# Patient Record
Sex: Female | Born: 1990 | Race: Black or African American | Hispanic: No | Marital: Single | Smoking: Never smoker
Health system: Southern US, Community
[De-identification: ages and names within clinical notes are randomized; demographics above are authoritative.]

---
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
## 2008-10-27 ENCOUNTER — Emergency Department (HOSPITAL_COMMUNITY): Admission: EM | Admit: 2008-10-27 | Discharge: 2008-10-27 | Payer: Self-pay | Admitting: Family Medicine

## 2010-07-07 ENCOUNTER — Emergency Department (HOSPITAL_COMMUNITY)
Admission: EM | Admit: 2010-07-07 | Discharge: 2010-07-07 | Payer: Self-pay | Source: Home / Self Care | Admitting: Family Medicine

## 2010-09-20 LAB — URINE CULTURE
Colony Count: >=100000
Culture  Setup Time: 201112282138

## 2010-09-20 LAB — POCT URINALYSIS DIPSTICK
Bilirubin Urine: NEGATIVE
Glucose, UA: NEGATIVE mg/dL
Ketones, ur: NEGATIVE mg/dL
Nitrite: NEGATIVE
Protein, ur: NEGATIVE mg/dL
Specific Gravity, Urine: 1.015 (ref 1.005–1.030)
Urobilinogen, UA: 0.2 mg/dL (ref 0.0–1.0)
pH: 7 (ref 5.0–8.0)

## 2010-09-20 LAB — POCT PREGNANCY, URINE: Preg Test, Ur: NEGATIVE

## 2010-10-20 LAB — POCT PREGNANCY, URINE: Preg Test, Ur: NEGATIVE

## 2010-10-20 LAB — POCT URINALYSIS DIP (DEVICE)
Bilirubin Urine: NEGATIVE
Glucose, UA: NEGATIVE mg/dL
Hgb urine dipstick: NEGATIVE
Ketones, ur: NEGATIVE mg/dL
Nitrite: NEGATIVE
Protein, ur: NEGATIVE mg/dL
Specific Gravity, Urine: 1.02 (ref 1.005–1.030)
Urobilinogen, UA: 1 mg/dL (ref 0.0–1.0)
pH: 7 (ref 5.0–8.0)

## 2010-10-20 LAB — RPR: RPR Ser Ql: NONREACTIVE

## 2010-10-20 LAB — HIV ANTIBODY (ROUTINE TESTING W REFLEX): HIV: NONREACTIVE

## 2010-10-20 LAB — GC/CHLAMYDIA PROBE AMP, URINE
Chlamydia, Swab/Urine, PCR: NEGATIVE
GC Probe Amp, Urine: POSITIVE — AB
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
## 2011-04-21 LAB — RPR: RPR Ser Ql: NONREACTIVE

## 2011-04-21 LAB — CBC
HCT: 27.2 — ABNORMAL LOW
HCT: 32.2 — ABNORMAL LOW
Hemoglobin: 10.8 — ABNORMAL LOW
Hemoglobin: 9.2 — ABNORMAL LOW
MCHC: 33.5
MCHC: 33.8
MCV: 83.7
MCV: 83.9
Platelets: 301
Platelets: 382 — ABNORMAL HIGH
RBC: 3.24 — ABNORMAL LOW
RBC: 3.85
RDW: 14.1 — ABNORMAL HIGH
RDW: 14.3 — ABNORMAL HIGH
WBC: 13.9 — ABNORMAL HIGH
WBC: 17.6 — ABNORMAL HIGH

## 2011-04-21 LAB — HEPATITIS B SURFACE ANTIGEN: Hepatitis B Surface Ag: NEGATIVE

## 2011-07-03 ENCOUNTER — Encounter: Payer: Self-pay | Admitting: *Deleted

## 2011-07-03 ENCOUNTER — Emergency Department (HOSPITAL_COMMUNITY): Payer: Self-pay

## 2011-07-03 ENCOUNTER — Emergency Department (HOSPITAL_COMMUNITY)
Admission: EM | Admit: 2011-07-03 | Discharge: 2011-07-03 | Disposition: A | Discharge status: Home / Self Care | Payer: Self-pay | Attending: Emergency Medicine | Admitting: Emergency Medicine

## 2011-07-03 DIAGNOSIS — R220 Localized swelling, mass and lump, head: Secondary | ICD-10-CM | POA: Insufficient documentation

## 2011-07-03 DIAGNOSIS — Y9229 Other specified public building as the place of occurrence of the external cause: Secondary | ICD-10-CM | POA: Insufficient documentation

## 2011-07-03 DIAGNOSIS — R51 Headache: Secondary | ICD-10-CM | POA: Insufficient documentation

## 2011-07-03 DIAGNOSIS — S0180XA Unspecified open wound of other part of head, initial encounter: Secondary | ICD-10-CM | POA: Insufficient documentation

## 2011-07-03 DIAGNOSIS — S51809A Unspecified open wound of unspecified forearm, initial encounter: Secondary | ICD-10-CM | POA: Insufficient documentation

## 2011-07-03 DIAGNOSIS — S1093XA Contusion of unspecified part of neck, initial encounter: Secondary | ICD-10-CM | POA: Insufficient documentation

## 2011-07-03 DIAGNOSIS — S0003XA Contusion of scalp, initial encounter: Secondary | ICD-10-CM | POA: Insufficient documentation

## 2011-07-03 MED ORDER — OXYCODONE-ACETAMINOPHEN 5-325 MG PO TABS: 2.0000 | ORAL_TABLET | ORAL | Status: AC | PRN

## 2011-07-03 MED ORDER — OXYCODONE-ACETAMINOPHEN 5-325 MG PO TABS
2.0000 | ORAL_TABLET | Freq: Once | ORAL | Status: AC
  Administered 2011-07-03: 2 via ORAL
  Filled 2011-07-03: qty 2

## 2011-07-03 MED ORDER — TETANUS-DIPHTH-ACELL PERTUSSIS 5-2.5-18.5 LF-MCG/0.5 IM SUSP
0.5000 mL | Freq: Once | INTRAMUSCULAR | Status: AC
  Administered 2011-07-03: 0.5 mL via INTRAMUSCULAR
  Filled 2011-07-03: qty 0.5
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
## 2011-07-03 NOTE — ED Provider Notes (Signed)
Medical screening examination/treatment/procedure(s) were performed by non-physician practitioner and as supervising physician I was immediately available for consultation/collaboration.  Ethelda Chick, MD 07/03/11 [phone]
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
## 2011-07-03 NOTE — ED Provider Notes (Signed)
Medical screening examination/treatment/procedure(s) were performed by non-physician practitioner and as supervising physician I was immediately available for consultation/collaboration.  Martha K Linker, MD 07/03/11 0921 

## 2011-07-03 NOTE — ED Notes (Signed)
Pt given discharge instructions, stated understanding, amb indep
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
## 2013-11-19 ENCOUNTER — Encounter (HOSPITAL_COMMUNITY): Payer: Self-pay | Admitting: Emergency Medicine

## 2013-11-19 ENCOUNTER — Emergency Department (INDEPENDENT_AMBULATORY_CARE_PROVIDER_SITE_OTHER)
Admission: EM | Admit: 2013-11-19 | Discharge: 2013-11-19 | Disposition: A | Discharge status: Home / Self Care | Payer: Self-pay | Source: Home / Self Care

## 2013-11-19 ENCOUNTER — Other Ambulatory Visit (HOSPITAL_COMMUNITY)
Admission: RE | Admit: 2013-11-19 | Discharge: 2013-11-19 | Disposition: A | Discharge status: Home / Self Care | Payer: Self-pay | Source: Ambulatory Visit | Attending: Family Medicine | Admitting: Family Medicine

## 2013-11-19 DIAGNOSIS — Z113 Encounter for screening for infections with a predominantly sexual mode of transmission: Secondary | ICD-10-CM | POA: Insufficient documentation

## 2013-11-19 DIAGNOSIS — N898 Other specified noninflammatory disorders of vagina: Secondary | ICD-10-CM

## 2013-11-19 DIAGNOSIS — N760 Acute vaginitis: Secondary | ICD-10-CM | POA: Insufficient documentation

## 2013-11-19 DIAGNOSIS — Z202 Contact with and (suspected) exposure to infections with a predominantly sexual mode of transmission: Secondary | ICD-10-CM

## 2013-11-19 LAB — POCT URINALYSIS DIP (DEVICE)
Bilirubin Urine: NEGATIVE
Glucose, UA: NEGATIVE mg/dL
Hgb urine dipstick: NEGATIVE
Ketones, ur: NEGATIVE mg/dL
Leukocytes, UA: NEGATIVE
Nitrite: NEGATIVE
Protein, ur: NEGATIVE mg/dL
Specific Gravity, Urine: 1.025 (ref 1.005–1.030)
Urobilinogen, UA: 0.2 mg/dL (ref 0.0–1.0)
pH: 6 (ref 5.0–8.0)

## 2013-11-19 LAB — POCT PREGNANCY, URINE: Preg Test, Ur: NEGATIVE

## 2013-11-19 MED ORDER — METRONIDAZOLE 500 MG PO TABS: 500.0000 mg | ORAL_TABLET | Freq: Two times a day (BID) | ORAL | Status: DC
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
## 2013-11-19 NOTE — ED Provider Notes (Signed)
Medical screening examination/treatment/procedure(s) were performed by resident physician or non-physician practitioner and as supervising physician I was immediately available for consultation/collaboration.   KINDL,JAMES DOUGLAS MD.   James D Kindl, MD 11/19/13 1719 

## 2013-11-19 NOTE — ED Notes (Signed)
Please call patient at [phone] for any lab issues

## 2013-11-19 NOTE — Discharge Instructions (Signed)
Vaginitis Vaginitis is an inflammation of the vagina. It is most often caused by a change in the normal balance of the bacteria and yeast that live in the vagina. This change in balance causes an overgrowth of certain bacteria or yeast, which causes the inflammation. There are different types of vaginitis, but the most common types are:  Bacterial vaginosis.  Yeast infection (candidiasis).  Trichomoniasis vaginitis. This is a sexually transmitted infection (STI).  Viral vaginitis.  Atropic vaginitis.  Allergic vaginitis. CAUSES  The cause depends on the type of vaginitis. Vaginitis can be caused by:  Bacteria (bacterial vaginosis).  Yeast (yeast infection).  A parasite (trichomoniasis vaginitis)  A virus (viral vaginitis).  Low hormone levels (atrophic vaginitis). Low hormone levels can occur during pregnancy, breastfeeding, or after menopause.  Irritants, such as bubble baths, scented tampons, and feminine sprays (allergic vaginitis). Other factors can change the normal balance of the yeast and bacteria that live in the vagina. These include:  Antibiotic medicines.  Poor hygiene.  Diaphragms, vaginal sponges, spermicides, birth control pills, and intrauterine devices (IUD).  Sexual intercourse.  Infection.  Uncontrolled diabetes.  A weakened immune system. SYMPTOMS  Symptoms can vary depending on the cause of the vaginitis. Common symptoms include:  Abnormal vaginal discharge.  The discharge is white, gray, or yellow with bacterial vaginosis.  The discharge is thick, white, and cheesy with a yeast infection.  The discharge is frothy and yellow or greenish with trichomoniasis.  A bad vaginal odor.  The odor is fishy with bacterial vaginosis.  Vaginal itching, pain, or swelling.  Painful intercourse.  Pain or burning when urinating. Sometimes, there are no symptoms. TREATMENT  Treatment will vary depending on the type of infection.   Bacterial  vaginosis and trichomoniasis are often treated with antibiotic creams or pills.  Yeast infections are often treated with antifungal medicines, such as vaginal creams or suppositories.  Viral vaginitis has no cure, but symptoms can be treated with medicines that relieve discomfort. Your sexual partner should be treated as well.  Atrophic vaginitis may be treated with an estrogen cream, pill, suppository, or vaginal ring. If vaginal dryness occurs, lubricants and moisturizing creams may help. You may be told to avoid scented soaps, sprays, or douches.  Allergic vaginitis treatment involves quitting the use of the product that is causing the problem. Vaginal creams can be used to treat the symptoms. HOME CARE INSTRUCTIONS   Take all medicines as directed by your caregiver.  Keep your genital area clean and dry. Avoid soap and only rinse the area with water.  Avoid douching. It can remove the healthy bacteria in the vagina.  Do not use tampons or have sexual intercourse until your vaginitis has been treated. Use sanitary pads while you have vaginitis.  Wipe from front to back. This avoids the spread of bacteria from the rectum to the vagina.  Let air reach your genital area.  Wear cotton underwear to decrease moisture buildup.  Avoid wearing underwear while you sleep until your vaginitis is gone.  Avoid tight pants and underwear or nylons without a cotton panel.  Take off wet clothing (especially bathing suits) as soon as possible.  Use mild, non-scented products. Avoid using irritants, such as:  Scented feminine sprays.  Fabric softeners.  Scented detergents.  Scented tampons.  Scented soaps or bubble baths.  Practice safe sex and use condoms. Condoms may prevent the spread of trichomoniasis and viral vaginitis. SEEK MEDICAL CARE IF:   You have abdominal pain.  You   have a fever or persistent symptoms for more than 2 3 days.  You have a fever and your symptoms suddenly  get worse. Document Released: 04/24/2007 Document Revised: 03/21/2012 Document Reviewed: 12/08/2011 [employer] Patient Information 2014 [employer].  

## 2013-11-20 LAB — CERVICOVAGINAL ANCILLARY ONLY
Chlamydia: NEGATIVE
Neisseria Gonorrhea: NEGATIVE
WET PREP (BD AFFIRM): NEGATIVE
WET PREP (BD AFFIRM): POSITIVE — AB
Wet Prep (BD Affirm): NEGATIVE

## 2013-11-20 NOTE — ED Notes (Signed)
GC/Chlamydia neg., Affirm: Candida and Trich neg., Gardnerella pos.  Pt. adequately treated with Flagyl. Desiree LucySuzanne M [facility] ClinicYork 11/20/2013

## 2014-05-12 ENCOUNTER — Encounter (HOSPITAL_COMMUNITY): Payer: Self-pay | Admitting: Emergency Medicine

## 2016-03-26 ENCOUNTER — Encounter (HOSPITAL_COMMUNITY): Payer: Self-pay | Admitting: Emergency Medicine

## 2016-03-26 ENCOUNTER — Emergency Department (HOSPITAL_COMMUNITY)
Admission: EM | Admit: 2016-03-26 | Discharge: 2016-03-26 | Disposition: A | Discharge status: Home / Self Care | Payer: Self-pay | Attending: Emergency Medicine | Admitting: Emergency Medicine

## 2016-03-26 DIAGNOSIS — Z792 Long term (current) use of antibiotics: Secondary | ICD-10-CM | POA: Insufficient documentation

## 2016-03-26 DIAGNOSIS — Z7252 High risk homosexual behavior: Secondary | ICD-10-CM | POA: Insufficient documentation

## 2016-03-26 DIAGNOSIS — Z7251 High risk heterosexual behavior: Secondary | ICD-10-CM

## 2016-03-26 LAB — WET PREP, GENITAL
SPERM: NONE SEEN
Trich, Wet Prep: NONE SEEN
YEAST WET PREP: NONE SEEN

## 2016-03-26 MED ORDER — AZITHROMYCIN 250 MG PO TABS
1000.0000 mg | ORAL_TABLET | Freq: Once | ORAL | Status: AC
  Administered 2016-03-26: 1000 mg via ORAL
  Filled 2016-03-26: qty 4

## 2016-03-26 MED ORDER — CEFTRIAXONE SODIUM 250 MG IJ SOLR
250.0000 mg | Freq: Once | INTRAMUSCULAR | Status: AC
  Administered 2016-03-26: 250 mg via INTRAMUSCULAR
  Filled 2016-03-26: qty 250

## 2016-03-26 MED ORDER — LIDOCAINE HCL 1 % IJ SOLN
INTRAMUSCULAR | Status: AC
  Administered 2016-03-26: 0.9 mL
  Filled 2016-03-26: qty 20

## 2016-03-26 NOTE — ED Provider Notes (Signed)
[facility]-EMERGENCY DEPT Provider Note   CSN: [id number] Arrival date & time: 03/26/16  0314     History   Chief Complaint Chief Complaint  Patient presents with  . Exposure to STD    HPI Audrey Joseph is a 25 y.o. female.  [age] year old female with no significant past medical history presents to the emergency department for STD evaluation. Patient states that she was notified that her female sexual partner was experiencing dysuria and penile discharge. She denies any symptoms, herself. She specifically denies fevers, vomiting, abdominal pain, vaginal bleeding, or vaginal discharge. She reports unprotected sexual intercourse with one female partner in the past 6 months.   The history is provided by the patient. No language interpreter was used.  Exposure to STD     History reviewed. No pertinent past medical history.  There are no active problems to display for this patient.   History reviewed. No pertinent surgical history.  OB History    Gravida Para Term Preterm AB Living   2 1 1  0 1 1   SAB TAB Ectopic Multiple Live Births   0 1 0 0         Home Medications    Prior to Admission medications   Medication Sig Start Date End Date Taking? Authorizing Provider  metroNIDAZOLE (FLAGYL) 500 MG tablet Take 1 tablet (500 mg total) by mouth 2 (two) times daily. X 7 days 11/19/13   Hayden Rasmussenavid Mabe, NP    Family History No family history on file.  Social History Social History  Substance Use Topics  . Smoking status: Never Smoker  . Smokeless tobacco: Never Used  . Alcohol use Yes     Allergies   Review of patient's allergies indicates no known allergies.   Review of Systems Review of Systems Ten systems reviewed and are negative for acute change, except as noted in the HPI.    Physical Exam Updated Vital Signs BP 140/90 (BP Location: Right Arm)   Pulse 68   Temp 98 F (36.7 C) (Oral)   LMP 03/05/2016 (Approximate)   SpO2 100%   Physical Exam  Constitutional:  She is oriented to person, place, and time. She appears well-developed and well-nourished. No distress.  Nontoxic and in no distress  HENT:  Head: Normocephalic and atraumatic.  Eyes: Conjunctivae and EOM are normal. No scleral icterus.  Neck: Normal range of motion.  Pulmonary/Chest: Effort normal. No respiratory distress.  Abdominal: Soft. She exhibits no distension. There is no tenderness. There is no guarding.  Soft, obese, nontender abdomen  Genitourinary: There is no rash, tenderness or lesion on the right labia. There is no rash, tenderness or lesion on the left labia. Uterus is not tender. Cervix exhibits no motion tenderness and no friability. Right adnexum displays no mass and no tenderness. Left adnexum displays no mass and no tenderness. No foreign body in the vagina.  Genitourinary Comments: Scant blood in vaginal vault. No cervical friability, cervical tenderness, or adnexal tenderness.  Musculoskeletal: Normal range of motion.  Neurological: She is alert and oriented to person, place, and time.  Skin: Skin is warm and dry. No rash noted. She is not diaphoretic. No erythema. No pallor.  Psychiatric: She has a normal mood and affect. Her behavior is normal.  Nursing note and vitals reviewed.    ED Treatments / Results  Labs (all labs ordered are listed, but only abnormal results are displayed) Labs Reviewed  WET PREP, GENITAL - Abnormal; Notable for the following:  Result Value   Clue Cells Wet Prep HPF POC PRESENT (*)    WBC, Wet Prep HPF POC MANY (*)    All other components within normal limits  GC/CHLAMYDIA PROBE AMP ([place]) NOT AT [facility]    EKG  EKG Interpretation None       Radiology No results found.  Procedures Procedures (including critical care time)  Medications Ordered in ED Medications  cefTRIAXone (ROCEPHIN) injection 250 mg (250 mg Intramuscular Given 03/26/16 0604)  azithromycin (ZITHROMAX) tablet 1,000 mg (1,000 mg Oral Given 03/26/16  0604)  lidocaine (XYLOCAINE) 1 % (with pres) injection (0.9 mLs  Given 03/26/16 0604)     Initial Impression / Assessment and Plan / ED Course  I have reviewed the triage vital signs and the nursing notes.  Pertinent labs & imaging results that were available during my care of the patient were reviewed by me and considered in my medical decision making (see chart for details).  Clinical Course    Patient to be discharged with instructions to follow up with the health department. Discussed importance of using protection when sexually active. Patient understands that they have GC/Chlamydia cultures pending and that they will need to inform all sexual partners if results return positive. Pt has been treated prophylacticly with azithromycin and rocephin due to pts history and wet prep with increased WBCs. Patient's symptoms not concerning for PID; she is hemodynamically stable and without cervical motion tenderness on pelvic exam. Return precautions discussed and provided. Patient discharged in satisfactory condition with no unaddressed concerns.   Final Clinical Impressions(s) / ED Diagnoses   Final diagnoses:  History of unprotected sex    New Prescriptions New Prescriptions   No medications on file     Antony Madura, PA-C 03/26/16 1610    Dione Booze, MD 03/26/16 [phone]

## 2016-03-26 NOTE — ED Triage Notes (Signed)
Pt here requesting STD check.  States she has had no symptoms at all but her partner has had discharge that started yesterday and she would like to be checked.

## 2016-03-26 NOTE — Discharge Instructions (Signed)
Do not engage in sexual intercourse for one week. Call to follow up on the results of your STD tests. Notify all sexual partners of their need to be tested and treated for STDs if your results are positive. Return for any new or concerning symptoms.

## 2016-03-28 LAB — GC/CHLAMYDIA PROBE AMP (~~LOC~~) NOT AT ARMC
CHLAMYDIA, DNA PROBE: NEGATIVE
NEISSERIA GONORRHEA: POSITIVE — AB

## 2016-03-29 ENCOUNTER — Telehealth (HOSPITAL_BASED_OUTPATIENT_CLINIC_OR_DEPARTMENT_OTHER): Payer: Self-pay | Admitting: Emergency Medicine

## 2016-05-03 ENCOUNTER — Telehealth (HOSPITAL_BASED_OUTPATIENT_CLINIC_OR_DEPARTMENT_OTHER): Payer: Self-pay | Admitting: Emergency Medicine

## 2016-05-03 NOTE — Telephone Encounter (Signed)
Lost to followup 

## 2020-07-11 NOTE — L&D Delivery Note (Addendum)
Operative Delivery Note  Patient pushed for less than 30 minutes after she was noted to be C/C/+1.  There was fetal bradycardia nadir 60's. Discussed with patient operative delivery with a vacuum. Verbal consent: obtained from patient. Risks and benefits discussed in detail.  Risks include, but are not limited to the risks of anesthesia, bleeding, infection, damage to maternal tissues, fetal cephalhematoma.  There is also the risk of inability to effect vaginal delivery of the head, or shoulder dystocia that cannot be resolved by established maneuvers, leading to the need for emergency cesarean section.  Head determined to be direct OP.  Bladder was just previously emptied by removal of indwelling foley catheter.  Epidural was found to be adequate.  Vertex was +3 station.   The Kiwi vacuum was placed.  With maternal effort and 3 pulls,  The baby head rotated and at 6:02 PM a viable and healthy female "Audrey Joseph" was delivered via Vaginal, Spontaneous.  Presentation: vertex; Position: Occiput,anterior; Station: +3. shoulders and body were easily delivered. Delayed cord clamping was done and cut by the father the infant handed to the waiting Pediatricians.  Infant was noted to be moving all four extremities and crying vigorously. Cord blood was obtained. The placenta delivered spontaneously intact 3 vessels noted.    IV pitocin and massage was done to alleviate any uterine atony.  Small right first degree vaginal laceration was repaired with 3-0 vicryl.  Patient tolerated delivery well, there were no complications.   APGAR: 8, 9; weight pending.    Anesthesia:  Epidural Instruments: Kiwi vacuum Episiotomy:  n/a Lacerations:  1st degree vaginal  Suture Repair: 3.0 vicryl Est. Blood Loss (mL):  200  Mom to postpartum.  Baby to Couplet care / Skin to Skin.  [name] 05/29/2021, 6:24 PM

## 2020-12-21 LAB — OB RESULTS CONSOLE RUBELLA ANTIBODY, IGM: Rubella: IMMUNE

## 2020-12-21 LAB — OB RESULTS CONSOLE HEPATITIS B SURFACE ANTIGEN: Hepatitis B Surface Ag: NEGATIVE

## 2021-02-08 HISTORY — PX: WISDOM TOOTH EXTRACTION: SHX21

## 2021-02-11 LAB — OB RESULTS CONSOLE GC/CHLAMYDIA: Gonorrhea: NEGATIVE

## 2021-03-22 LAB — OB RESULTS CONSOLE HIV ANTIBODY (ROUTINE TESTING): HIV: NONREACTIVE

## 2021-05-28 ENCOUNTER — Inpatient Hospital Stay (HOSPITAL_COMMUNITY)
Admission: AD | Admit: 2021-05-28 | Discharge: 2021-05-31 | DRG: 807 | Disposition: A | Discharge status: Home / Self Care | Payer: Medicaid Other | Attending: Obstetrics & Gynecology | Admitting: Obstetrics & Gynecology

## 2021-05-28 ENCOUNTER — Other Ambulatory Visit: Payer: Self-pay

## 2021-05-28 ENCOUNTER — Encounter (HOSPITAL_COMMUNITY): Payer: Self-pay | Admitting: Obstetrics & Gynecology

## 2021-05-28 DIAGNOSIS — O9902 Anemia complicating childbirth: Secondary | ICD-10-CM | POA: Diagnosis present

## 2021-05-28 DIAGNOSIS — R030 Elevated blood-pressure reading, without diagnosis of hypertension: Secondary | ICD-10-CM | POA: Diagnosis not present

## 2021-05-28 DIAGNOSIS — Z20822 Contact with and (suspected) exposure to covid-19: Secondary | ICD-10-CM | POA: Diagnosis present

## 2021-05-28 DIAGNOSIS — O99214 Obesity complicating childbirth: Secondary | ICD-10-CM | POA: Diagnosis present

## 2021-05-28 DIAGNOSIS — O4292 Full-term premature rupture of membranes, unspecified as to length of time between rupture and onset of labor: Principal | ICD-10-CM | POA: Diagnosis present

## 2021-05-28 DIAGNOSIS — O99893 Other specified diseases and conditions complicating puerperium: Secondary | ICD-10-CM | POA: Diagnosis not present

## 2021-05-28 DIAGNOSIS — O3663X0 Maternal care for excessive fetal growth, third trimester, not applicable or unspecified: Secondary | ICD-10-CM | POA: Diagnosis present

## 2021-05-28 DIAGNOSIS — D573 Sickle-cell trait: Secondary | ICD-10-CM

## 2021-05-28 DIAGNOSIS — Z3A37 37 weeks gestation of pregnancy: Secondary | ICD-10-CM

## 2021-05-28 DIAGNOSIS — Z23 Encounter for immunization: Secondary | ICD-10-CM

## 2021-05-28 NOTE — MAU Note (Signed)
..  Audrey Joseph is a 30 y.o. at [redacted]w[redacted]d here in MAU reporting: A gush of fluid around 11:15pm. Denies contractions. +FM.

## 2021-05-29 ENCOUNTER — Inpatient Hospital Stay (HOSPITAL_COMMUNITY): Payer: Medicaid Other | Admitting: Anesthesiology

## 2021-05-29 ENCOUNTER — Encounter (HOSPITAL_COMMUNITY): Payer: Self-pay | Admitting: Obstetrics & Gynecology

## 2021-05-29 DIAGNOSIS — Z3A37 37 weeks gestation of pregnancy: Secondary | ICD-10-CM

## 2021-05-29 DIAGNOSIS — Z23 Encounter for immunization: Secondary | ICD-10-CM | POA: Diagnosis not present

## 2021-05-29 DIAGNOSIS — R030 Elevated blood-pressure reading, without diagnosis of hypertension: Secondary | ICD-10-CM | POA: Diagnosis not present

## 2021-05-29 DIAGNOSIS — O9902 Anemia complicating childbirth: Secondary | ICD-10-CM | POA: Diagnosis present

## 2021-05-29 DIAGNOSIS — O99214 Obesity complicating childbirth: Secondary | ICD-10-CM | POA: Diagnosis present

## 2021-05-29 DIAGNOSIS — Z20822 Contact with and (suspected) exposure to covid-19: Secondary | ICD-10-CM | POA: Diagnosis present

## 2021-05-29 DIAGNOSIS — O4202 Full-term premature rupture of membranes, onset of labor within 24 hours of rupture: Secondary | ICD-10-CM | POA: Diagnosis not present

## 2021-05-29 DIAGNOSIS — O99893 Other specified diseases and conditions complicating puerperium: Secondary | ICD-10-CM | POA: Diagnosis not present

## 2021-05-29 DIAGNOSIS — O3663X0 Maternal care for excessive fetal growth, third trimester, not applicable or unspecified: Secondary | ICD-10-CM | POA: Diagnosis present

## 2021-05-29 DIAGNOSIS — D573 Sickle-cell trait: Secondary | ICD-10-CM | POA: Diagnosis present

## 2021-05-29 DIAGNOSIS — O4292 Full-term premature rupture of membranes, unspecified as to length of time between rupture and onset of labor: Secondary | ICD-10-CM | POA: Diagnosis present

## 2021-05-29 DIAGNOSIS — O26893 Other specified pregnancy related conditions, third trimester: Secondary | ICD-10-CM | POA: Diagnosis present

## 2021-05-29 LAB — CBC
HCT: 31.4 % — ABNORMAL LOW (ref 36.0–46.0)
HCT: 28.5 % — ABNORMAL LOW (ref 36.0–46.0)
Hemoglobin: 10.5 g/dL — ABNORMAL LOW (ref 12.0–15.0)
Hemoglobin: 9.7 g/dL — ABNORMAL LOW (ref 12.0–15.0)
MCH: 26.9 pg (ref 26.0–34.0)
MCH: 27.2 pg (ref 26.0–34.0)
MCHC: 33.4 g/dL (ref 30.0–36.0)
MCHC: 34 g/dL (ref 30.0–36.0)
MCV: 80.3 fL (ref 80.0–100.0)
MCV: 79.8 fL — ABNORMAL LOW (ref 80.0–100.0)
Platelets: 392 10*3/uL (ref 150–400)
Platelets: 356 10*3/uL (ref 150–400)
RBC: 3.91 MIL/uL (ref 3.87–5.11)
RBC: 3.57 MIL/uL — ABNORMAL LOW (ref 3.87–5.11)
RDW: 14.2 % (ref 11.5–15.5)
RDW: 14 % (ref 11.5–15.5)
WBC: 11.6 10*3/uL — ABNORMAL HIGH (ref 4.0–10.5)
WBC: 19.2 10*3/uL — ABNORMAL HIGH (ref 4.0–10.5)
nRBC: 0 % (ref 0.0–0.2)
nRBC: 0 % (ref 0.0–0.2)

## 2021-05-29 LAB — COMPREHENSIVE METABOLIC PANEL
ALT: 15 U/L (ref 0–44)
AST: 22 U/L (ref 15–41)
Albumin: 2.3 g/dL — ABNORMAL LOW (ref 3.5–5.0)
Alkaline Phosphatase: 99 U/L (ref 38–126)
Anion gap: 8 (ref 5–15)
BUN: 6 mg/dL (ref 6–20)
CO2: 19 mmol/L — ABNORMAL LOW (ref 22–32)
Calcium: 9 mg/dL (ref 8.9–10.3)
Chloride: 108 mmol/L (ref 98–111)
Creatinine, Ser: 0.71 mg/dL (ref 0.44–1.00)
GFR, Estimated: >60 mL/min (ref >60)
Glucose, Bld: 113 mg/dL — ABNORMAL HIGH (ref 70–99)
Potassium: 3.6 mmol/L (ref 3.5–5.1)
Sodium: 135 mmol/L (ref 135–145)
Total Bilirubin: 0.4 mg/dL (ref 0.3–1.2)
Total Protein: 5.9 g/dL — ABNORMAL LOW (ref 6.5–8.1)

## 2021-05-29 LAB — POCT FERN TEST: POCT Fern Test: POSITIVE

## 2021-05-29 LAB — TYPE AND SCREEN
ABO/RH(D): A POS
Antibody Screen: NEGATIVE

## 2021-05-29 LAB — RESP PANEL BY RT-PCR (FLU A&B, COVID) ARPGX2
Influenza A by PCR: NEGATIVE
Influenza B by PCR: NEGATIVE
SARS Coronavirus 2 by RT PCR: NEGATIVE

## 2021-05-29 LAB — HIV ANTIBODY (ROUTINE TESTING W REFLEX): HIV Screen 4th Generation wRfx: NONREACTIVE

## 2021-05-29 LAB — RPR: RPR Ser Ql: NONREACTIVE

## 2021-05-29 LAB — GROUP B STREP BY PCR: Group B strep by PCR: NEGATIVE

## 2021-05-29 MED ORDER — PRENATAL MULTIVITAMIN CH
1.0000 | ORAL_TABLET | Freq: Every day | ORAL | Status: DC
  Administered 2021-05-30 – 2021-05-31 (×2): 1 via ORAL
  Filled 2021-05-29 (×2): qty 1

## 2021-05-29 MED ORDER — BUPIVACAINE HCL (PF) 0.25 % IJ SOLN
INTRAMUSCULAR | Status: DC | PRN
  Administered 2021-05-29: 10 mL via EPIDURAL

## 2021-05-29 MED ORDER — OXYCODONE-ACETAMINOPHEN 5-325 MG PO TABS: 2.0000 | ORAL_TABLET | ORAL | Status: DC | PRN

## 2021-05-29 MED ORDER — ONDANSETRON HCL 4 MG/2ML IJ SOLN: 4.0000 mg | Freq: Four times a day (QID) | INTRAMUSCULAR | Status: DC | PRN

## 2021-05-29 MED ORDER — PHENYLEPHRINE 40 MCG/ML (10ML) SYRINGE FOR IV PUSH (FOR BLOOD PRESSURE SUPPORT): 80.0000 ug | PREFILLED_SYRINGE | INTRAVENOUS | Status: DC | PRN

## 2021-05-29 MED ORDER — SIMETHICONE 80 MG PO CHEW: 80.0000 mg | CHEWABLE_TABLET | ORAL | Status: DC | PRN

## 2021-05-29 MED ORDER — PENICILLIN G POT IN DEXTROSE 60000 UNIT/ML IV SOLN: 3.0000 10*6.[IU] | INTRAVENOUS | Status: DC

## 2021-05-29 MED ORDER — DIPHENHYDRAMINE HCL 25 MG PO CAPS: 25.0000 mg | ORAL_CAPSULE | Freq: Four times a day (QID) | ORAL | Status: DC | PRN

## 2021-05-29 MED ORDER — OXYTOCIN-SODIUM CHLORIDE 30-0.9 UT/500ML-% IV SOLN: 2.5000 [IU]/h | INTRAVENOUS | Status: DC | PRN

## 2021-05-29 MED ORDER — ACETAMINOPHEN 325 MG PO TABS
650.0000 mg | ORAL_TABLET | ORAL | Status: DC | PRN
  Administered 2021-05-29 – 2021-05-30 (×3): 650 mg via ORAL
  Filled 2021-05-29 (×3): qty 2

## 2021-05-29 MED ORDER — OXYTOCIN-SODIUM CHLORIDE 30-0.9 UT/500ML-% IV SOLN
1.0000 m[IU]/min | INTRAVENOUS | Status: DC
  Administered 2021-05-29: 2 m[IU]/min via INTRAVENOUS
  Filled 2021-05-29: qty 500

## 2021-05-29 MED ORDER — OXYCODONE-ACETAMINOPHEN 5-325 MG PO TABS: 1.0000 | ORAL_TABLET | ORAL | Status: DC | PRN

## 2021-05-29 MED ORDER — LACTATED RINGERS IV SOLN
500.0000 mL | Freq: Once | INTRAVENOUS | Status: AC
  Administered 2021-05-29: 500 mL via INTRAVENOUS

## 2021-05-29 MED ORDER — ONDANSETRON HCL 4 MG PO TABS: 4.0000 mg | ORAL_TABLET | ORAL | Status: DC | PRN

## 2021-05-29 MED ORDER — SODIUM CHLORIDE 0.9 % IV SOLN: 5.0000 10*6.[IU] | Freq: Once | INTRAVENOUS | Status: DC

## 2021-05-29 MED ORDER — EPHEDRINE 5 MG/ML INJ: 10.0000 mg | INTRAVENOUS | Status: DC | PRN

## 2021-05-29 MED ORDER — ONDANSETRON HCL 4 MG/2ML IJ SOLN: 4.0000 mg | INTRAMUSCULAR | Status: DC | PRN

## 2021-05-29 MED ORDER — OXYCODONE HCL 5 MG PO TABS: 5.0000 mg | ORAL_TABLET | ORAL | Status: DC | PRN

## 2021-05-29 MED ORDER — SENNOSIDES-DOCUSATE SODIUM 8.6-50 MG PO TABS
2.0000 | ORAL_TABLET | Freq: Every day | ORAL | Status: DC
  Administered 2021-05-30 – 2021-05-31 (×2): 2 via ORAL
  Filled 2021-05-29 (×2): qty 2

## 2021-05-29 MED ORDER — IBUPROFEN 600 MG PO TABS
600.0000 mg | ORAL_TABLET | Freq: Four times a day (QID) | ORAL | Status: DC
  Administered 2021-05-29 – 2021-05-31 (×7): 600 mg via ORAL
  Filled 2021-05-29 (×7): qty 1

## 2021-05-29 MED ORDER — OXYTOCIN-SODIUM CHLORIDE 30-0.9 UT/500ML-% IV SOLN
2.5000 [IU]/h | INTRAVENOUS | Status: DC
  Administered 2021-05-29: 2.5 [IU]/h via INTRAVENOUS
  Filled 2021-05-29: qty 500

## 2021-05-29 MED ORDER — TERBUTALINE SULFATE 1 MG/ML IJ SOLN: 0.2500 mg | Freq: Once | INTRAMUSCULAR | Status: DC | PRN

## 2021-05-29 MED ORDER — BENZOCAINE-MENTHOL 20-0.5 % EX AERO: 1.0000 "application " | INHALATION_SPRAY | CUTANEOUS | Status: DC | PRN

## 2021-05-29 MED ORDER — LIDOCAINE HCL (PF) 1 % IJ SOLN: 30.0000 mL | INTRAMUSCULAR | Status: DC | PRN

## 2021-05-29 MED ORDER — WITCH HAZEL-GLYCERIN EX PADS: 1.0000 "application " | MEDICATED_PAD | CUTANEOUS | Status: DC | PRN

## 2021-05-29 MED ORDER — OXYTOCIN BOLUS FROM INFUSION
333.0000 mL | Freq: Once | INTRAVENOUS | Status: AC
  Administered 2021-05-29: 333 mL via INTRAVENOUS

## 2021-05-29 MED ORDER — ACETAMINOPHEN 325 MG PO TABS: 650.0000 mg | ORAL_TABLET | ORAL | Status: DC | PRN

## 2021-05-29 MED ORDER — SOD CITRATE-CITRIC ACID 500-334 MG/5ML PO SOLN: 30.0000 mL | ORAL | Status: DC | PRN

## 2021-05-29 MED ORDER — LIDOCAINE-EPINEPHRINE (PF) 2 %-1:200000 IJ SOLN
INTRAMUSCULAR | Status: DC | PRN
  Administered 2021-05-29: 3 mL via EPIDURAL
  Administered 2021-05-29: 5 mL via EPIDURAL

## 2021-05-29 MED ORDER — OXYCODONE HCL 5 MG PO TABS: 10.0000 mg | ORAL_TABLET | ORAL | Status: DC | PRN

## 2021-05-29 MED ORDER — DIBUCAINE (PERIANAL) 1 % EX OINT: 1.0000 "application " | TOPICAL_OINTMENT | CUTANEOUS | Status: DC | PRN

## 2021-05-29 MED ORDER — LACTATED RINGERS IV SOLN: INTRAVENOUS | Status: DC

## 2021-05-29 MED ORDER — PHENYLEPHRINE 40 MCG/ML (10ML) SYRINGE FOR IV PUSH (FOR BLOOD PRESSURE SUPPORT)
80.0000 ug | PREFILLED_SYRINGE | INTRAVENOUS | Status: DC | PRN
  Filled 2021-05-29: qty 10

## 2021-05-29 MED ORDER — FENTANYL CITRATE (PF) 100 MCG/2ML IJ SOLN
100.0000 ug | INTRAMUSCULAR | Status: DC | PRN
  Administered 2021-05-29 (×2): 100 ug via INTRAVENOUS
  Filled 2021-05-29 (×3): qty 2

## 2021-05-29 MED ORDER — DIPHENHYDRAMINE HCL 50 MG/ML IJ SOLN: 12.5000 mg | INTRAMUSCULAR | Status: DC | PRN

## 2021-05-29 MED ORDER — FENTANYL CITRATE (PF) 100 MCG/2ML IJ SOLN
INTRAMUSCULAR | Status: DC | PRN
  Administered 2021-05-29: 100 ug via EPIDURAL

## 2021-05-29 MED ORDER — TETANUS-DIPHTH-ACELL PERTUSSIS 5-2.5-18.5 LF-MCG/0.5 IM SUSY
0.5000 mL | PREFILLED_SYRINGE | Freq: Once | INTRAMUSCULAR | Status: AC
  Administered 2021-05-30: 0.5 mL via INTRAMUSCULAR
  Filled 2021-05-29: qty 0.5

## 2021-05-29 MED ORDER — FENTANYL-BUPIVACAINE-NACL 0.5-0.125-0.9 MG/250ML-% EP SOLN
12.0000 mL/h | EPIDURAL | Status: DC | PRN
  Administered 2021-05-29: 12 mL/h via EPIDURAL
  Filled 2021-05-29: qty 250

## 2021-05-29 MED ORDER — LACTATED RINGERS IV SOLN
500.0000 mL | INTRAVENOUS | Status: DC | PRN
  Administered 2021-05-29 (×2): 500 mL via INTRAVENOUS

## 2021-05-29 MED ORDER — COCONUT OIL OIL: 1.0000 "application " | TOPICAL_OIL | Status: DC | PRN

## 2021-05-29 MED ORDER — ZOLPIDEM TARTRATE 5 MG PO TABS: 5.0000 mg | ORAL_TABLET | Freq: Every evening | ORAL | Status: DC | PRN

## 2021-05-29 NOTE — Progress Notes (Signed)
MD Progress Note  Audrey Joseph is a 30 y.o. G3P1011 at [redacted]w[redacted]d by LMP admitted for PROM  Subjective: Patient c/o 10/10 pain on left side despite epidural  Objective: BP 125/74   Pulse 78   Temp 98.2 F (36.8 C) (Oral)   Resp 16   Ht 5\' 6"  (1.676 m)   Wt (!) 138.3 kg   SpO2 100%   BMI 49.23 kg/m  No intake/output data recorded. Total I/O In: -  Out: 300 [Urine:300]  FHT:  FHR: 120 bpm, variability: moderate,  accelerations:  Present 10x10,  decelerations:  Absent UC:   regular, every 1-2 minutes SVE:   Dilation: 4 Effacement (%): 60 Station: -2 Exam by:: Dr. [email or web address] FSE and IUPC placed  Labs: Lab Results  Component Value Date   WBC 11.6 (H) 05/29/2021   HGB 10.5 (L) 05/29/2021   HCT 31.4 (L) 05/29/2021   MCV 80.3 05/29/2021   PLT 392 05/29/2021    Assessment / Plan: Induction of labor due to PROM,  progressing well on pitocin  Labor: Progressing normally Preeclampsia:  no signs or symptoms of toxicity Fetal Wellbeing:  Category I Pain Control:  Epidural - not working Anesthesia called I/D:  n/a Anticipated MOD:  NSVD  Kikue Gerhart 05/29/2021, 3:24 PM

## 2021-05-29 NOTE — Lactation Note (Signed)
This note was copied from a baby's chart. Lactation Consultation Note  Patient Name: Audrey Joseph Today's Date: 05/29/2021 Reason for consult: L&D Initial assessment Age:32 hours  Mom holding infant STS in [name] assisted with latching. Infant latched well after a few attempts.  Rhythmic sucking noted.    Encouraged STS and hand expression prior to latching.    Maternal Data Has patient been taught Hand Expression?: Yes Does the patient have breastfeeding experience prior to this delivery?: Yes How long did the patient breastfeed?: breastfeed her 30 year old for 3 weeks  Feeding Mother's Current Feeding Choice: Breast Milk  LATCH Score Latch: Repeated attempts needed to sustain latch, nipple held in mouth throughout feeding, stimulation needed to elicit sucking reflex.  Audible Swallowing: A few with stimulation  Type of Nipple: Everted at rest and after stimulation (left nipple is shorter shaft with edema around the nipple, right nipple is soft and areolar tissue is compressible.)  Comfort (Breast/Nipple): Soft / non-tender  Hold (Positioning): Assistance needed to correctly position infant at breast and maintain latch.  LATCH Score: 7   Lactation Tools Discussed/Used    Interventions Interventions: Skin to skin;Assisted with latch;Hand express  Discharge    Consult Status Consult Status: Follow-up Date: 05/30/21 Follow-up type: In-patient    Maryruth Hancock [facility] 05/29/2021, 8:16 PM

## 2021-05-29 NOTE — Progress Notes (Signed)
Audrey Joseph is a 30 y.o. G3P1011 at [redacted]w[redacted]d admitted for rupture of membranes  Subjective:  Resting comfortably. Feeling occasional ctx. Pitocin R/B/A discussed. Pt consented.  Objective: BP 113/74   Pulse 92   Temp 97.6 F (36.4 C) (Oral)   Resp 18   Ht 5\' 6"  (1.676 m)   Wt (!) 138.3 kg   SpO2 100%   BMI 49.23 kg/m  No intake/output data recorded. No intake/output data recorded.  FHT:  FHR: 130 bpm, variability: moderate,  accelerations:  Present,  decelerations:  Absent UC:   irregular, every 8-10 minutes SVE:   Dilation: 1.5 Effacement (%): 60 Station: -2 Exam by:: [email or web address], CNM  Labs: Lab Results  Component Value Date   WBC 11.6 (H) 05/29/2021   HGB 10.5 (L) 05/29/2021   HCT 31.4 (L) 05/29/2021   MCV 80.3 05/29/2021   PLT 392 05/29/2021    Assessment / Plan: Admitted for PROM @ 2315 on 05/28/21, clear. Augmentation with pitocin.   Labor:  Pitocin 2x2 Fetal Wellbeing:  Category I Pain Control:  IV pain meds I/D:   GBS negative Anticipated MOD:  NSVD  05/30/21 Chasen Mendell MSN, CNM 05/29/2021, 5:13 AM

## 2021-05-29 NOTE — Anesthesia Preprocedure Evaluation (Signed)
Anesthesia Evaluation  Patient identified by MRN, date of birth, ID band Patient awake    Reviewed: Allergy & Precautions, NPO status , Patient's Chart, lab work & pertinent test results  Airway Mallampati: III  TM Distance: >3 FB Neck ROM: Full    Dental no notable dental hx.    Pulmonary neg pulmonary ROS,    Pulmonary exam normal breath sounds clear to auscultation       Cardiovascular negative cardio ROS Normal cardiovascular exam Rhythm:Regular Rate:Normal     Neuro/Psych negative neurological ROS  negative psych ROS   GI/Hepatic negative GI ROS, Neg liver ROS,   Endo/Other  Morbid obesity (BMI 49)  Renal/GU negative Renal ROS  negative genitourinary   Musculoskeletal negative musculoskeletal ROS (+)   Abdominal (+) + obese,   Peds  Hematology negative hematology ROS (+)   Anesthesia Other Findings   Reproductive/Obstetrics (+) Pregnancy                             Anesthesia Physical Anesthesia Plan  ASA: 3  Anesthesia Plan: Epidural   Post-op Pain Management:    Induction:   PONV Risk Score and Plan: Treatment may vary due to age or medical condition  Airway Management Planned: Natural Airway  Additional Equipment:   Intra-op Plan:   Post-operative Plan:   Informed Consent: I have reviewed the patients History and Physical, chart, labs and discussed the procedure including the risks, benefits and alternatives for the proposed anesthesia with the patient or authorized representative who has indicated his/her understanding and acceptance.       Plan Discussed with: Anesthesiologist  Anesthesia Plan Comments: (Patient identified. Risks, benefits, options discussed with patient including but not limited to bleeding, infection, nerve damage, paralysis, failed block, incomplete pain control, headache, blood pressure changes, nausea, vomiting, reactions to medication,  itching, and post partum back pain. Confirmed with bedside nurse the patient's most recent platelet count. Confirmed with the patient that they are not taking any anticoagulation, have any bleeding history or any family history of bleeding disorders. Patient expressed understanding and wishes to proceed. All questions were answered. )        Anesthesia Quick Evaluation

## 2021-05-29 NOTE — H&P (Signed)
OB ADMISSION/ HISTORY & PHYSICAL:  Admission Date: 05/28/2021 11:37 PM  Admit Diagnosis: SROM  Audrey Joseph is a 30 y.o. female G3P1011 [redacted]w[redacted]d presenting for SROM @ 2315. Endorses active FM, denies LOF, ctxs and vaginal bleeding.   History of current pregnancy: G3P1011   Primary OB Provider: [facility] Patient entered care with [facility] at 14 wks.   EDC 06/16/21 by 14.5 wk U/S LMP 10/17/20 Anatomy scan:  27.5 wks, complete w/ anterior placenta.   Antenatal testing: for BMI >40 started at 31.5 weeks Last evaluation: 35.6  wks  Significant prenatal events:   Vit D deficiency Sickle cell trait Anemia of pregnancy BMI 43 LGA- EFW @ 31.5 wks 5lbs 2 oz, 96%, Last baby @ 36wks 7lbs 3 oz HX of premature delivery @ 36wks, declined 17p Late PNC @ 14 wks   Prenatal Labs: ABO, Rh: A Positive  Antibody:  Negative Rubella:   Immune RPR:   NR HBsAg:   NR HIV:   NR GTT: 88 GBS:   unknown GC/CHL: Negative/negative Genetics: Low risk Tdap/influenza vaccines:     OB History  Gravida Para Term Preterm AB Living  3 1 1  0 1 1  SAB IAB Ectopic Multiple Live Births  0 1 0 0      # Outcome Date GA Lbr Len/2nd Weight Sex Delivery Anes PTL Lv  3 Current           2 IAB           1 Term             Medical / Surgical History: Past medical history: History reviewed. No pertinent past medical history.  Past surgical history: History reviewed. No pertinent surgical history. Family History: History reviewed. No pertinent family history.  Social History:  reports that she has never smoked. She has never used smokeless tobacco. She reports current alcohol use. She reports current drug use. Drug: Marijuana.  Allergies: Patient has no known allergies.   Current Medications at time of admission:  Prior to Admission medications   Medication Sig Start Date End Date Taking? Authorizing Provider  metroNIDAZOLE (FLAGYL) 500 MG tablet Take 1 tablet (500 mg total) by mouth 2 (two) times daily. X 7 days  11/19/13   01/19/14, NP    Review of Systems: Constitutional: Negative   HENT: Negative   Eyes: Negative   Respiratory: Negative   Cardiovascular: Negative   Gastrointestinal: Negative  Genitourinary: negative for bloody show, positive for LOF   Musculoskeletal: Negative   Skin: Negative   Neurological: Negative   Endo/Heme/Allergies: Negative   Psychiatric/Behavioral: Negative    Physical Exam: VS: Blood pressure 118/75, pulse 88, temperature 97.6 F (36.4 C), temperature source Oral, resp. rate 18, height 5\' 6"  (1.676 m), weight (!) 138.3 kg, SpO2 100 %. AAO x3, no signs of distress Cardiovascular: RRR Respiratory: Lung fields clear to ausculation GU/GI: Abdomen gravid, non-tender, non-distended, active FM, vertex per ultrasound in MAU Extremities: negative edema, negative for pain, tenderness, and cords  Cervical exam:Dilation: 1 Effacement (%): 50 Station: -3 Exam by:: , RN FHR: baseline rate 120 / variability moderate / accelerations present / absent decelerations TOCO: 1-6   Prenatal Transfer Tool  Maternal Diabetes: No Genetic Screening: Normal Maternal Ultrasounds/Referrals: Normal Fetal Ultrasounds or other Referrals:  None Maternal Substance Abuse:  No Significant Maternal Medications:  None Significant Maternal Lab Results: Other:  GBS unknown. PCR pending    Assessment: 30 y.o. G3P1011 [redacted]w[redacted]d admitted for SROM @ 2315,  clear fluid.   Latent stage of labor FHR category 1 GBS unknown. PCR pending Pain management plan: IV pain meds   Plan:  Admit to L&D Routine admission orders IV pain meds and Epidural PRN If GBS positive treat with PCN, PCR pending Will notified  Dr Mora Appl of admission and plan of care  Carollee Leitz MSN, CNM 05/29/2021 1:02 AM

## 2021-05-29 NOTE — Progress Notes (Signed)
Call from RN to report elevated BP. CMP, CBC, and urine protein creatinine ordered.  Rhea Pink, MSN, CNM 05/29/2021 10:14 PM

## 2021-05-29 NOTE — Progress Notes (Signed)
MD Progress Note - late entry - patient seen and examined at 0900 am  Audrey Joseph is a 30 y.o. G3P1011 at 100w3d by LMP admitted for rupture of membranes  Subjective:   Objective: BP 137/83   Pulse 78   Temp 98.2 F (36.8 C) (Oral)   Resp 16   Ht 5\' 6"  (1.676 m)   Wt (!) 138.3 kg   SpO2 100%   BMI 49.23 kg/m  No intake/output data recorded. No intake/output data recorded.  FHT:  FHR: 120  bpm, variability: moderate,  accelerations:  Present,  decelerations:  Absent UC:   regular, every 2 minutes SVE:   Dilation: 2 Effacement (%): 60 Station: -2 Exam by:: [email or web address] RN  Labs: Lab Results  Component Value Date   WBC 11.6 (H) 05/29/2021   HGB 10.5 (L) 05/29/2021   HCT 31.4 (L) 05/29/2021   MCV 80.3 05/29/2021   PLT 392 05/29/2021    Assessment / Plan: Induction of labor due to PROM,  progressing well on pitocin  Labor: Progressing normally Preeclampsia:  no signs or symptoms of toxicity Fetal Wellbeing:  Category I Pain Control:  Epidural I/D:  n/a Anticipated MOD:  NSVD  Audrey Joseph 05/29/2021, 1:45 PM

## 2021-05-29 NOTE — Anesthesia Procedure Notes (Signed)

## 2021-05-29 NOTE — MAU Provider Note (Signed)
[id number] Noble T Holden 1990-12-27  Patient informed that the ultrasound is considered a limited OB ultrasound and is not intended to be a complete ultrasound exam.  Patient also informed that the ultrasound is not being completed with the intent of assessing for fetal or placental anomalies or any pelvic abnormalities.  Explained that the purpose of today's ultrasound is to assess for  presentation.  Patient acknowledges the purpose of the exam and the limitations of the study.  Cephalic presentation noted  Cherre Robins, CNM 05/29/2021, 1:00 AM

## 2021-05-29 NOTE — MAU Note (Signed)
Vertex confirmed via bedside US performed by Gerrit Heck, CNM.

## 2021-05-30 LAB — PROTEIN / CREATININE RATIO, URINE
Creatinine, Urine: 35.73 mg/dL
Protein Creatinine Ratio: 4.76 mg/mg{Cre} — ABNORMAL HIGH (ref 0.00–0.15)
Total Protein, Urine: 170 mg/dL

## 2021-05-30 NOTE — Anesthesia Postprocedure Evaluation (Signed)
Anesthesia Post Note  Patient: Audrey Joseph  Procedure(s) Performed: AN AD HOC LABOR EPIDURAL     Patient location during evaluation: Mother Baby Anesthesia Type: Epidural Level of consciousness: awake Pain management: satisfactory to patient Vital Signs Assessment: post-procedure vital signs reviewed and stable Respiratory status: spontaneous breathing Cardiovascular status: stable Anesthetic complications: no   No notable events documented.  Last Vitals:  Vitals:   05/30/21 0119 05/30/21 0500  BP: 134/76 122/72  Pulse: (!) 101 94  Resp:    Temp:    SpO2:      Last Pain:  Vitals:   05/30/21 0514  TempSrc:   PainSc: 0-No pain   Pain Goal: Patients Stated Pain Goal: 0 (05/29/21 1650)                 Cephus Shelling

## 2021-05-30 NOTE — Lactation Note (Signed)
This note was copied from a baby's chart. Lactation Consultation Note  Patient Name: Audrey Joseph [id number] Date: 05/30/2021   Age:32 hours LC entered the room, mom had visitors at this time, mom will call when ready for [facility] services.  Maternal Data    Feeding Nipple Type: Slow - flow  LATCH Score                    Lactation Tools Discussed/Used    Interventions    Discharge    Consult Status      [name] 05/30/2021, 7:12 PM

## 2021-05-30 NOTE — Progress Notes (Signed)
Urine protein creatine ratio 4.76. CNM to bedside for eval.  S:  Denies HA, visual changes, and epigastric pain. Reports "lots of blood" in urine specimen. Discussed cath specimen to repeat PCR and pt declines.   O: Focused exam: Gen: AAO x3 Neuro: neg clonus, +2 reflexes Ext: trace edema, neg for pain, neg for tenderness  Vitals with BMI 05/30/2021 05/29/2021 05/29/2021  Height - - -  Weight - - -  BMI - - -  Systolic 134 143 546  Diastolic 76 78 74  Pulse 101 106 119    CBC    Component Value Date/Time   WBC 19.2 (H) 05/29/2021 2225   RBC 3.57 (L) 05/29/2021 2225   HGB 9.7 (L) 05/29/2021 2225   HCT 28.5 (L) 05/29/2021 2225   PLT 356 05/29/2021 2225   MCV 79.8 (L) 05/29/2021 2225   MCH 27.2 05/29/2021 2225   MCHC 34.0 05/29/2021 2225   RDW 14.0 05/29/2021 2225   CMP     Component Value Date/Time   NA 135 05/29/2021 2225   K 3.6 05/29/2021 2225   CL 108 05/29/2021 2225   CO2 19 (L) 05/29/2021 2225   GLUCOSE 113 (H) 05/29/2021 2225   BUN 6 05/29/2021 2225   CREATININE 0.71 05/29/2021 2225   CALCIUM 9.0 05/29/2021 2225   PROT 5.9 (L) 05/29/2021 2225   ALBUMIN 2.3 (L) 05/29/2021 2225   AST 22 05/29/2021 2225   ALT 15 05/29/2021 2225   ALKPHOS 99 05/29/2021 2225   BILITOT 0.4 05/29/2021 2225   GFRNONAA >60 05/29/2021 2225    A/P: Normal PP course Ruled out PIH/pre-e    -CMP and CBC normal    -Lochia present in urine PCR sample     -Pt declines cath for repeat PCR      -current BP is normotensive    -will cont to monitor Dr. Mora Appl to be updated.   Rhea Pink, MSN, CNM 05/30/2021 3:07 AM

## 2021-05-30 NOTE — Progress Notes (Signed)
Post Partum Day 1 s/p VAVD Subjective: no complaints, up ad lib, voiding, tolerating PO, and denies heavy vaginal bleeding Patient denies headache, blurry vision, no scotomata, no RUQ pain  Objective: Blood pressure 122/72, pulse 94, temperature 98.1 F (36.7 C), resp. rate 16, height 5\' 6"  (1.676 m), weight (!) 138.3 kg, SpO2 100 %, unknown if currently breastfeeding.  Physical Exam:  General: alert, cooperative, and no distress Lochia: appropriate Uterine Fundus: firm Incision: n/a DVT Evaluation: No evidence of DVT seen on physical exam. Negative Homan's sign. No cords or calf tenderness.  Recent Labs    05/29/21 0109 05/29/21 2225  HGB 10.5* 9.7*  HCT 31.4* 28.5*    Assessment/Plan: 30 yo P2 PPD#1 s/p VAVD elevated BPs post partum, currently normotensive Will continue routine postpartum care Possible discharge later today or tomorrow Mother and baby are bonding well.    LOS: 1 day   [facility] 05/30/2021, 7:23 AM

## 2021-05-30 NOTE — Lactation Note (Signed)
This note was copied from a baby's chart. Lactation Consultation Note  Patient Name: Audrey Joseph [id number] Date: 05/30/2021 Reason for consult: Follow-up assessment;Mother's request Age:32 hours  P2, Mother requested assistance but was tired and falling asleep when [facility] was in the room.  Mother briefly stated baby latched but would not stay on long. Discussed with parents that baby recently has 25 ml of formula and may not be hungry.  Suggest following volume guidelines and call for assistance with further latches as needed.   Feeding Mother's Current Feeding Choice: Breast Milk and Formula  Interventions Interventions: Education  Consult Status Consult Status: Follow-up Date: 05/31/21 Follow-up type: In-patient    Dahlia Byes [facility] 05/30/2021, 10:30 AM

## 2021-05-31 DIAGNOSIS — D573 Sickle-cell trait: Secondary | ICD-10-CM

## 2021-05-31 MED ORDER — ACETAMINOPHEN 325 MG PO TABS
650.0000 mg | ORAL_TABLET | ORAL | Status: AC | PRN
Start: 2021-05-31 — End: ?

## 2021-05-31 MED ORDER — IBUPROFEN 600 MG PO TABS
600.0000 mg | ORAL_TABLET | Freq: Four times a day (QID) | ORAL | 0 refills | Status: AC
Start: 2021-05-31 — End: ?

## 2021-05-31 NOTE — Discharge Summary (Signed)
OB Discharge Summary  Patient Name: Audrey Joseph DOB: March 23, 1991 MRN: [id number]  Date of admission: 05/28/2021 Intrauterine pregnancy: [redacted]w[redacted]d   Admitting diagnosis: Normal labor and delivery [O80]   Date of discharge: 05/31/2021    Discharge diagnosis: Term Pregnancy Delivered     Prenatal history: [id number]   EDC : 06/16/2021, by Other Basis  Prenatal care at [facility]  Prenatal course complicated by  Anemia Sickle cell trait Obesity  Prenatal Labs: ABO, Rh: --/--/A POS (11/19 0109)  Antibody: NEG (11/19 0109) Rubella: Immune (06/13 0000)   RPR: NON REACTIVE (11/19 0109)  HBsAg: Negative (06/13 0000)  HIV: Non Reactive (11/19 0109)  GBS: NEGATIVE/-- (11/19 0110)                                    Hospital course:  Onset of Labor With Vaginal Delivery      30 y.o. yo [id number] at [redacted]w[redacted]d was admitted in Latent Labor on 05/28/2021. Patient had an uncomplicated labor course as follows:  Membrane Rupture Time/Date: 11:15 PM ,05/28/2021   Delivery Method:Vaginal, Vacuum (Extractor)  Episiotomy: None  Lacerations:  1st degree;Vaginal  Patient had an uncomplicated postpartum course.  She is ambulating, tolerating a regular diet, passing flatus, and urinating well. Patient is discharged home in stable condition on 05/31/21.  Newborn Data: Birth date:05/29/2021  Birth time:6:02 PM  Gender:Female  Living status:Living  Apgars:8 ,9  Weight:3180 g  Delivering PROVIDER: Essie Hart                                                            Complications: None  Newborn Data: Live born female  Birth Weight: 7 lb 0.2 oz (3180 g) APGAR: 8, 9  Newborn Delivery   Birth date/time: 05/29/2021 18:02:00 Delivery type: Vaginal, Vacuum (Extractor)      Baby Feeding: Bottle and Breast Disposition:home with mother  Post partum procedures: none  Labs: Lab Results  Component Value Date   WBC 19.2 (H) 05/29/2021   HGB 9.7 (L) 05/29/2021   HCT 28.5 (L) 05/29/2021   MCV 79.8  (L) 05/29/2021   PLT 356 05/29/2021   CMP Latest Ref Rng & Units 05/29/2021  Glucose 70 - 99 mg/dL 683(M)  BUN 6 - 20 mg/dL 6  Creatinine 1.96 - 2.22 mg/dL 9.79  Sodium 892 - 119 mmol/L 135  Potassium 3.5 - 5.1 mmol/L 3.6  Chloride 98 - 111 mmol/L 108  CO2 22 - 32 mmol/L 19(L)  Calcium 8.9 - 10.3 mg/dL 9.0  Total Protein 6.5 - 8.1 g/dL 5.9(L)  Total Bilirubin 0.3 - 1.2 mg/dL 0.4  Alkaline Phos 38 - 126 U/L 99  AST 15 - 41 U/L 22  ALT 0 - 44 U/L 15    Physical Exam @ time of discharge:  Vitals:   05/30/21 1300 05/30/21 2017 05/31/21 0530 05/31/21 0910  BP: 126/73 (!) 127/95 124/90 105/65  Pulse: 85 92 87 88  Resp: 19   18  Temp: 97.9 F (36.6 C)   97.9 F (36.6 C)  TempSrc: Axillary   Oral  SpO2:      Weight:      Height:       general: alert, cooperative, and  no distress lochia: appropriate uterine fundus: firm perineum: well approximated, healing extremities:slight bilateral edema DVT Evaluation: No evidence of DVT seen on physical exam.  Discharge instructions:  "Baby and Me Booklet" and [facility] Booklet Discharge Medications:  Allergies as of 05/31/2021   No Known Allergies      Medication List     STOP taking these medications    metroNIDAZOLE 500 MG tablet Commonly known as: FLAGYL   vitamin C 250 MG tablet Commonly known as: ASCORBIC ACID       TAKE these medications    acetaminophen 325 MG tablet Commonly known as: TYLENOL Take 2 tablets (650 mg total) by mouth every 4 (four) hours as needed (for pain scale < 4  OR  temperature  >/=  100.5 F).   cholecalciferol 25 MCG (1000 UNIT) tablet Commonly known as: VITAMIN D3 Take 1,000 Units by mouth daily.   ibuprofen 600 MG tablet Commonly known as: ADVIL Take 1 tablet (600 mg total) by mouth every 6 (six) hours.   prenatal multivitamin Tabs tablet Take 1 tablet by mouth daily at 12 noon.       Diet: routine diet Activity: Advance as tolerated. Pelvic rest x 6 weeks.  Follow up:6  weeks  Signed: Oley Balm MSN, CNM 05/31/2021, 11:42 AM

## 2021-05-31 NOTE — Lactation Note (Signed)
This note was copied from a baby's chart. Lactation Consultation Note  Patient Name: Audrey Joseph [id number] Date: 05/31/2021 Reason for consult: Follow-up assessment;Early term 37-38.6wks Age:32 hours  Mom is uncomfortable with pumping and latching infant. Mom has compression stripes bilaterally. Specifics of an asymmetric latch were shown via [employer].   Mom has been using size 24 flanges, but she needs size 21 flanges. I provided two size 21 flanges & observed her pump for a few minutes with good results. Mom feels that "Lyla Son" would be interested in taking more from bottle than what she has been taking. I provided "bottle feeding your baby" guidelines.   Parents' questions answered to their satisfaction. Parents know how to reach [place] for post-discharge questions.    Lurline Hare [facility] 05/31/2021, 10:14 AM

## 2021-06-09 ENCOUNTER — Telehealth (HOSPITAL_COMMUNITY): Payer: Self-pay | Admitting: *Deleted

## 2021-06-09 NOTE — Telephone Encounter (Signed)
Attempted hospital discharge follow-up call. No voicemail box set up. Deforest Hoyles, RN, 06/09/21, [phone]

## 2021-06-30 NOTE — H&P (Signed)
HISTORY AND PHYSICAL  Audrey Joseph is a 30 y.o. female patient with CC: No pain. Referred by general dentist for extraction wisdom teeth.  No diagnosis found.  No past medical history on file.  No current facility-administered medications for this encounter.   Current Outpatient Medications  Medication Sig Dispense Refill   acetaminophen (TYLENOL) 325 MG tablet Take 2 tablets (650 mg total) by mouth every 4 (four) hours as needed (for pain scale < 4  OR  temperature  >/=  100.5 F).     Prenatal Vit-Fe Fumarate-FA (PRENATAL MULTIVITAMIN) TABS tablet Take 1 tablet by mouth daily at 12 noon.     ibuprofen (ADVIL) 600 MG tablet Take 1 tablet (600 mg total) by mouth every 6 (six) hours. (Patient not taking: Reported on 06/25/2021) 30 tablet 0   No Known Allergies Active Problems:   * No active hospital problems. *  Vitals: unknown if currently breastfeeding. Lab results:No results found for this or any previous visit (from the past 24 hour(s)). Radiology Results: No results found. General appearance: alert, cooperative, and no distress Head: Normocephalic, without obvious abnormality, atraumatic Eyes: negative Nose: Nares normal. Septum midline. Mucosa normal. No drainage or sinus tenderness. Throat: Impacted teeth # 16, 17, 32. No purulence, edema, fluctuance, trismus. Pharynx clear.  Neck: no adenopathy and supple, symmetrical, trachea midline Resp: clear to auscultation bilaterally Cardio: regular rate and rhythm, S1, S2 normal, no murmur, click, rub or gallop  Assessment: Impacted teeth # 16, 17, 32.   Plan: Extractions with GA. Day surgery.   Audrey Joseph 06/30/2021

## 2021-07-01 ENCOUNTER — Other Ambulatory Visit: Payer: Self-pay

## 2021-07-01 ENCOUNTER — Encounter (HOSPITAL_COMMUNITY): Payer: Self-pay | Admitting: Oral Surgery

## 2021-07-01 NOTE — Progress Notes (Signed)
PCP - pt denies Cardiologist - pt denies EKG -  Chest x-ray -  ECHO -  Cardiac Cath -  CPAP -   ERAS Protcol -   COVID TEST- n/a  Anesthesia review: n/a  -------------  SDW INSTRUCTIONS:  Your procedure is scheduled on Tuesday 12/27. Please report to [facility] Main Entrance "A" at 0530 A.M., and check in at the Admitting office. Call this number if you have problems the morning of surgery: [phone]   Remember: Do not eat or drink after midnight the night before your surgery   Medications to take morning of surgery with a sip of water include: Tylenol - prn   As of today, STOP taking any Aspirin (unless otherwise instructed by your surgeon), Aleve, Naproxen, Ibuprofen, Motrin, Advil, Goody's, BC's, all herbal medications, fish oil, and all vitamins.    The Morning of Surgery Do not wear jewelry, make-up or nail polish. Do not wear lotions, powders, or perfumes or deodorant  Do not bring valuables to the hospital. [facility] is not responsible for any belongings or valuables.  If you are a smoker, DO NOT Smoke 24 hours prior to surgery  If you wear a CPAP at night please bring your mask the morning of surgery   Remember that you must have someone to transport you home after your surgery, and remain with you for 24 hours if you are discharged the same day.  Please bring cases for contacts, glasses, hearing aids, dentures or bridgework because it cannot be worn into surgery.   Patients discharged the day of surgery will not be allowed to drive home.   Please shower the NIGHT BEFORE/MORNING OF SURGERY (use antibacterial soap like DIAL soap if possible). Wear comfortable clothes the morning of surgery. Oral Hygiene is also important to reduce your risk of infection.  Remember - BRUSH YOUR TEETH THE MORNING OF SURGERY WITH YOUR REGULAR TOOTHPASTE  Patient denies shortness of breath, fever, cough and chest pain.

## 2021-07-05 NOTE — Anesthesia Preprocedure Evaluation (Addendum)
Anesthesia Evaluation  Patient identified by MRN, date of birth, ID band  Reviewed: Allergy & Precautions, NPO status , Patient's Chart, lab work & pertinent test results  Airway Mallampati: I  TM Distance: >3 FB Neck ROM: Full    Dental no notable dental hx. (+) Teeth Intact, Dental Advisory Given   Pulmonary neg pulmonary ROS,    Pulmonary exam normal breath sounds clear to auscultation       Cardiovascular negative cardio ROS Normal cardiovascular exam Rhythm:Regular Rate:Normal     Neuro/Psych negative neurological ROS  negative psych ROS   GI/Hepatic negative GI ROS, Neg liver ROS,   Endo/Other  Morbid obesity (BMI 45)  Renal/GU negative Renal ROS  negative genitourinary   Musculoskeletal negative musculoskeletal ROS (+)   Abdominal   Peds  Hematology negative hematology ROS (+) Sickle cell trait ,   Anesthesia Other Findings   Reproductive/Obstetrics                            Anesthesia Physical Anesthesia Plan  ASA: 3  Anesthesia Plan: General   Post-op Pain Management: Tylenol PO (pre-op)   Induction: Intravenous  PONV Risk Score and Plan: 3 and Midazolam, Dexamethasone and Ondansetron  Airway Management Planned: Nasal ETT  Additional Equipment:   Intra-op Plan:   Post-operative Plan: Extubation in OR  Informed Consent: I have reviewed the patients History and Physical, chart, labs and discussed the procedure including the risks, benefits and alternatives for the proposed anesthesia with the patient or authorized representative who has indicated his/her understanding and acceptance.     Dental advisory given  Plan Discussed with: CRNA  Anesthesia Plan Comments:         Anesthesia Quick Evaluation

## 2021-07-06 ENCOUNTER — Other Ambulatory Visit: Payer: Self-pay

## 2021-07-06 ENCOUNTER — Ambulatory Visit (HOSPITAL_COMMUNITY): Payer: Medicaid Other | Admitting: Anesthesiology

## 2021-07-06 ENCOUNTER — Encounter (HOSPITAL_COMMUNITY)
Admission: RE | Disposition: A | Discharge status: Home / Self Care | Payer: Self-pay | Source: Home / Self Care | Attending: Oral Surgery

## 2021-07-06 ENCOUNTER — Encounter (HOSPITAL_COMMUNITY): Payer: Self-pay | Admitting: Oral Surgery

## 2021-07-06 ENCOUNTER — Ambulatory Visit (HOSPITAL_COMMUNITY)
Admission: RE | Admit: 2021-07-06 | Discharge: 2021-07-06 | Disposition: A | Discharge status: Home / Self Care | Payer: Medicaid Other | Attending: Oral Surgery | Admitting: Oral Surgery

## 2021-07-06 DIAGNOSIS — K011 Impacted teeth: Secondary | ICD-10-CM | POA: Insufficient documentation

## 2021-07-06 DIAGNOSIS — Z6841 Body Mass Index (BMI) 40.0 and over, adult: Secondary | ICD-10-CM | POA: Diagnosis not present

## 2021-07-06 DIAGNOSIS — K029 Dental caries, unspecified: Secondary | ICD-10-CM | POA: Insufficient documentation

## 2021-07-06 HISTORY — PX: TOOTH EXTRACTION: SHX859

## 2021-07-06 LAB — POCT PREGNANCY, URINE: Preg Test, Ur: NEGATIVE

## 2021-07-06 SURGERY — DENTAL RESTORATION/EXTRACTIONS
Anesthesia: General | Site: Mouth

## 2021-07-06 MED ORDER — OXYMETAZOLINE HCL 0.05 % NA SOLN
NASAL | Status: DC | PRN
  Administered 2021-07-06 (×2): 2 via NASAL

## 2021-07-06 MED ORDER — PROPOFOL 10 MG/ML IV BOLUS
INTRAVENOUS | Status: DC | PRN
  Administered 2021-07-06: 150 mg via INTRAVENOUS

## 2021-07-06 MED ORDER — LIDOCAINE 2% (20 MG/ML) 5 ML SYRINGE
INTRAMUSCULAR | Status: AC
  Filled 2021-07-06: qty 5

## 2021-07-06 MED ORDER — LIDOCAINE-EPINEPHRINE 2 %-1:100000 IJ SOLN
INTRAMUSCULAR | Status: DC | PRN
  Administered 2021-07-06: 10 mL via INTRADERMAL

## 2021-07-06 MED ORDER — DEXTROSE 5 % IV SOLN
INTRAVENOUS | Status: DC | PRN
  Administered 2021-07-06: 07:00:00 3 g via INTRAVENOUS

## 2021-07-06 MED ORDER — CHLORHEXIDINE GLUCONATE 0.12 % MT SOLN
OROMUCOSAL | Status: AC
  Administered 2021-07-06: 06:00:00 15 mL via OROMUCOSAL
  Filled 2021-07-06: qty 15

## 2021-07-06 MED ORDER — LIDOCAINE 2% (20 MG/ML) 5 ML SYRINGE
INTRAMUSCULAR | Status: DC | PRN
  Administered 2021-07-06: 100 mg via INTRAVENOUS

## 2021-07-06 MED ORDER — 0.9 % SODIUM CHLORIDE (POUR BTL) OPTIME
TOPICAL | Status: DC | PRN
  Administered 2021-07-06: 08:00:00 1000 mL

## 2021-07-06 MED ORDER — CEFAZOLIN IN SODIUM CHLORIDE 3-0.9 GM/100ML-% IV SOLN: 3.0000 g | INTRAVENOUS | Status: DC

## 2021-07-06 MED ORDER — SODIUM CHLORIDE 0.9 % IR SOLN
Status: DC | PRN
  Administered 2021-07-06: 1000 mL

## 2021-07-06 MED ORDER — SUGAMMADEX SODIUM 200 MG/2ML IV SOLN
INTRAVENOUS | Status: DC | PRN
Start: 2021-07-06 — End: 2021-07-06
  Administered 2021-07-06: 400 mg via INTRAVENOUS

## 2021-07-06 MED ORDER — MIDAZOLAM HCL 2 MG/2ML IJ SOLN
INTRAMUSCULAR | Status: DC | PRN
  Administered 2021-07-06: 2 mg via INTRAVENOUS

## 2021-07-06 MED ORDER — DEXAMETHASONE SODIUM PHOSPHATE 10 MG/ML IJ SOLN
INTRAMUSCULAR | Status: DC | PRN
  Administered 2021-07-06: 10 mg via INTRAVENOUS

## 2021-07-06 MED ORDER — LACTATED RINGERS IV SOLN: INTRAVENOUS | Status: DC

## 2021-07-06 MED ORDER — ROCURONIUM BROMIDE 10 MG/ML (PF) SYRINGE
PREFILLED_SYRINGE | INTRAVENOUS | Status: DC | PRN
  Administered 2021-07-06: 80 mg via INTRAVENOUS

## 2021-07-06 MED ORDER — HYDROCODONE-ACETAMINOPHEN 5-325 MG PO TABS: 1.0000 | ORAL_TABLET | Freq: Four times a day (QID) | ORAL | 0 refills | Status: AC | PRN

## 2021-07-06 MED ORDER — ROCURONIUM BROMIDE 10 MG/ML (PF) SYRINGE
PREFILLED_SYRINGE | INTRAVENOUS | Status: AC
  Filled 2021-07-06: qty 10

## 2021-07-06 MED ORDER — PROPOFOL 10 MG/ML IV BOLUS
INTRAVENOUS | Status: AC
  Filled 2021-07-06: qty 20

## 2021-07-06 MED ORDER — MIDAZOLAM HCL 2 MG/2ML IJ SOLN
INTRAMUSCULAR | Status: AC
  Filled 2021-07-06: qty 2

## 2021-07-06 MED ORDER — OXYMETAZOLINE HCL 0.05 % NA SOLN
NASAL | Status: AC
  Filled 2021-07-06: qty 30

## 2021-07-06 MED ORDER — ONDANSETRON HCL 4 MG/2ML IJ SOLN
INTRAMUSCULAR | Status: AC
  Filled 2021-07-06: qty 2

## 2021-07-06 MED ORDER — CHLORHEXIDINE GLUCONATE 0.12 % MT SOLN: 15.0000 mL | Freq: Once | OROMUCOSAL | Status: AC

## 2021-07-06 MED ORDER — FENTANYL CITRATE (PF) 250 MCG/5ML IJ SOLN
INTRAMUSCULAR | Status: AC
  Filled 2021-07-06: qty 5

## 2021-07-06 MED ORDER — CEFAZOLIN IN SODIUM CHLORIDE 3-0.9 GM/100ML-% IV SOLN
INTRAVENOUS | Status: AC
  Filled 2021-07-06: qty 100

## 2021-07-06 MED ORDER — ORAL CARE MOUTH RINSE: 15.0000 mL | Freq: Once | OROMUCOSAL | Status: AC

## 2021-07-06 MED ORDER — ACETAMINOPHEN 500 MG PO TABS
ORAL_TABLET | ORAL | Status: AC
  Administered 2021-07-06: 06:00:00 1000 mg via ORAL
  Filled 2021-07-06: qty 2

## 2021-07-06 MED ORDER — LIDOCAINE-EPINEPHRINE 2 %-1:100000 IJ SOLN
INTRAMUSCULAR | Status: AC
  Filled 2021-07-06: qty 1

## 2021-07-06 MED ORDER — FENTANYL CITRATE (PF) 100 MCG/2ML IJ SOLN
INTRAMUSCULAR | Status: AC
  Filled 2021-07-06: qty 2

## 2021-07-06 MED ORDER — FENTANYL CITRATE (PF) 100 MCG/2ML IJ SOLN
25.0000 ug | INTRAMUSCULAR | Status: DC | PRN
  Administered 2021-07-06 (×2): 50 ug via INTRAVENOUS

## 2021-07-06 MED ORDER — ACETAMINOPHEN 500 MG PO TABS: 1000.0000 mg | ORAL_TABLET | Freq: Once | ORAL | Status: AC

## 2021-07-06 MED ORDER — DEXAMETHASONE SODIUM PHOSPHATE 10 MG/ML IJ SOLN
INTRAMUSCULAR | Status: AC
  Filled 2021-07-06: qty 1

## 2021-07-06 MED ORDER — ONDANSETRON HCL 4 MG/2ML IJ SOLN
INTRAMUSCULAR | Status: DC | PRN
  Administered 2021-07-06: 4 mg via INTRAVENOUS

## 2021-07-06 MED ORDER — FENTANYL CITRATE (PF) 250 MCG/5ML IJ SOLN
INTRAMUSCULAR | Status: DC | PRN
  Administered 2021-07-06: 100 ug via INTRAVENOUS

## 2021-07-06 SURGICAL SUPPLY — 39 items
BAG COUNTER SPONGE SURGICOUNT (BAG) IMPLANT
BAG SURGICOUNT SPONGE COUNTING (BAG)
BLADE SURG 15 STRL LF DISP TIS (BLADE) ×1 IMPLANT
BLADE SURG 15 STRL SS (BLADE) ×3
BUR CROSS CUT FISSURE 1.6 (BURR) ×2 IMPLANT
BUR CROSS CUT FISSURE 1.6MM (BURR) ×1
BUR EGG ELITE 4.0 (BURR) ×1 IMPLANT
BUR EGG ELITE 4.0MM (BURR)
CANISTER SUCT 3000ML PPV (MISCELLANEOUS) ×3 IMPLANT
COVER SURGICAL LIGHT HANDLE (MISCELLANEOUS) ×3 IMPLANT
DECANTER SPIKE VIAL GLASS SM (MISCELLANEOUS) ×1 IMPLANT
DRAPE U-SHAPE 76X120 STRL (DRAPES) ×3 IMPLANT
GAUZE PACKING FOLDED 2  STR (GAUZE/BANDAGES/DRESSINGS) ×3
GAUZE PACKING FOLDED 2 STR (GAUZE/BANDAGES/DRESSINGS) ×1 IMPLANT
GLOVE SURG ENC MOIS LTX SZ6.5 (GLOVE) IMPLANT
GLOVE SURG ENC MOIS LTX SZ7 (GLOVE) IMPLANT
GLOVE SURG ENC MOIS LTX SZ8 (GLOVE) ×3 IMPLANT
GLOVE SURG UNDER POLY LF SZ6.5 (GLOVE) IMPLANT
GLOVE SURG UNDER POLY LF SZ7 (GLOVE) IMPLANT
GOWN STRL REUS W/ TWL LRG LVL3 (GOWN DISPOSABLE) ×1 IMPLANT
GOWN STRL REUS W/ TWL XL LVL3 (GOWN DISPOSABLE) ×1 IMPLANT
GOWN STRL REUS W/TWL LRG LVL3 (GOWN DISPOSABLE) ×3
GOWN STRL REUS W/TWL XL LVL3 (GOWN DISPOSABLE) ×3
IV NS 1000ML (IV SOLUTION) ×3
IV NS 1000ML BAXH (IV SOLUTION) ×1 IMPLANT
KIT BASIN OR (CUSTOM PROCEDURE TRAY) ×3 IMPLANT
KIT TURNOVER KIT B (KITS) ×3 IMPLANT
NDL HYPO 25GX1X1/2 BEV (NEEDLE) ×2 IMPLANT
NEEDLE HYPO 25GX1X1/2 BEV (NEEDLE) ×6 IMPLANT
NS IRRIG 1000ML POUR BTL (IV SOLUTION) ×3 IMPLANT
PAD ARMBOARD 7.5X6 YLW CONV (MISCELLANEOUS) ×3 IMPLANT
SLEEVE IRRIGATION ELITE 7 (MISCELLANEOUS) ×3 IMPLANT
SPONGE SURGIFOAM ABS GEL 12-7 (HEMOSTASIS) IMPLANT
SUT CHROMIC 3 0 PS 2 (SUTURE) ×3 IMPLANT
SYR BULB IRRIG 60ML STRL (SYRINGE) ×3 IMPLANT
SYR CONTROL 10ML LL (SYRINGE) ×3 IMPLANT
TRAY ENT MC OR (CUSTOM PROCEDURE TRAY) ×3 IMPLANT
TUBING IRRIGATION (MISCELLANEOUS) ×3 IMPLANT
YANKAUER SUCT BULB TIP NO VENT (SUCTIONS) ×3 IMPLANT

## 2021-07-06 NOTE — Anesthesia Procedure Notes (Signed)
Procedure Name: Intubation Date/Time: 07/06/2021 7:34 AM Performed by: Michele Rockers, CRNA Pre-anesthesia Checklist: Patient identified, Emergency Drugs available, Suction available and Patient being monitored Patient Re-evaluated:Patient Re-evaluated prior to induction Oxygen Delivery Method: Circle system utilized Preoxygenation: Pre-oxygenation with 100% oxygen Induction Type: IV induction Ventilation: Mask ventilation without difficulty and Oral airway inserted - appropriate to patient size Laryngoscope Size: Mac, 3 and Glidescope Nasal Tubes: Nasal prep performed, Nasal Rae and Right Tube size: 7.0 mm Placement Confirmation: ETT inserted through vocal cords under direct vision, positive ETCO2 and breath sounds checked- equal and bilateral Tube secured with: Tape Dental Injury: Teeth and Oropharynx as per pre-operative assessment

## 2021-07-06 NOTE — Transfer of Care (Signed)
Immediate Anesthesia Transfer of Care Note  Patient: Patricia T Hedtke  Procedure(s) Performed: EXTRACTION TEETH NUMBER SIXTEEN, SEVENTEEN, THIRY-TWO (Mouth)  Patient Location: PACU  Anesthesia Type:General  Level of Consciousness: drowsy and patient cooperative  Airway & Oxygen Therapy: Patient Spontanous Breathing and Patient connected to nasal cannula oxygen  Post-op Assessment: Report given to RN and Post -op Vital signs reviewed and stable  Post vital signs: Reviewed and stable  Last Vitals:  Vitals Value Taken Time  BP 156/104 07/06/21 0815  Temp    Pulse    Resp 23 07/06/21 0820  SpO2    Vitals shown include unvalidated device data.  Last Pain:  Vitals:   07/06/21 0615  TempSrc:   PainSc: 0-No pain      Patients Stated Pain Goal: 3 (07/06/21 0615)  Complications: No notable events documented.

## 2021-07-06 NOTE — Op Note (Signed)
07/06/2021  8:07 AM  PATIENT:  Temperence T Chirino  30 y.o. female  PRE-OPERATIVE DIAGNOSIS:  NONRESTORABLE TOOTH #16 SECONDARY TO DENTAL CARIES, IMPACTED TEETH 17 AND 32  POST-OPERATIVE DIAGNOSIS:  SAME  PROCEDURE:  Procedure(s): EXTRACTION TEETH NUMBER SIXTEEN, SEVENTEEN, THIRY-TWO  SURGEON:  Surgeon(s): Ocie Doyne, DMD  ANESTHESIA:   local and general  EBL:  minimal  DRAINS: none   SPECIMEN:  No Specimen  COUNTS:  YES  PLAN OF CARE: Discharge to home after PACU  PATIENT DISPOSITION:  PACU - hemodynamically stable.   PROCEDURE DETAILS: Dictation #[id number]  Georgia Lopes, DMD 07/06/2021 8:07 AM

## 2021-07-06 NOTE — H&P (Signed)
H&P documentation  -History and Physical Reviewed  -Patient has been re-examined  -No change in the plan of care  Audrey Joseph  

## 2021-07-06 NOTE — Op Note (Signed)
NAME: Audrey Joseph, RUFFINI MEDICAL RECORD NO: [id number] ACCOUNT NO: [id number] DATE OF BIRTH: 1990/08/03 FACILITY: [facility] LOCATION: [facility] PHYSICIAN: Georgia Lopes, DDS  Operative Report   DATE OF PROCEDURE: 07/06/2021  PREOPERATIVE DIAGNOSES:  Nonrestorable tooth #16 secondary to dental caries and impacted teeth #17 and 32.  POSTOPERATIVE DIAGNOSES:  Nonrestorable tooth #16 secondary to dental caries and impacted teeth #17 and 32.  PROCEDURE PERFORMED:  Extraction of teeth 16, 17 and 32.  SURGEON:  Georgia Lopes, DDS  ANESTHESIA:  General, nasal intubation.  ATTENDING:  Dr. Armond Hang.  DESCRIPTION OF PROCEDURE:  The patient was taken to the OR and placed on the table in supine position.  General anesthesia was administered and nasoendotracheal tube was placed and secured.  The eyes were protected.  The patient was draped for surgery.   Timeout was performed.  The posterior pharynx was suctioned.  A throat pack was placed.  2% lidocaine 1:100,000 epinephrine was infiltrated in inferior alveolar block on the right and left side and in buccal and palatal infiltration around tooth #16.  A  bite block was placed in the right side of the mouth.  A sweetheart retractor was used to retract the tongue.  A #15 blade was used to make an incision overlying tooth #17 carried forward to the embracer between teeth #18 and #19 and an incision was  created in the gingival sulcus around tooth #16.  The periosteum was reflected around the upper tooth and the flap was reflected in the mandible.  The [employer] handpiece with a fissure bur was used to remove bone circumferentially around tooth #17.  The  tooth was sectioned into multiple pieces and then removed with a 301 elevator and rongeurs.  The socket was then curetted, irrigated and closed with 3-0 chromic in the maxilla with 15 blade.  The 301 elevator was used to elevate tooth #16.  The tooth was  removed with dental forceps.  The socket was curetted,  irrigated and closed with 3-0 chromic.  The bite block and sweetheart retractor repositioned to the other side of the mouth.  A 15 blade was used to make an incision overlying tooth #32.  The  periosteum was reflected.  Bone was removed from around the tooth and the tooth was sectioned in multiple pieces and then the roots were removed using a 301 elevator and hemostats.  Then, the socket was curetted, irrigated and closed with 3-0 chromic.   The oral cavity was then irrigated and suctioned.  The throat pack was removed.  The patient was left under the care of anesthesia for extubation and transport to recovery room with plans for discharge home through day surgery.  ESTIMATED BLOOD LOSS:  Minimal.  COMPLICATIONS:  None.  SPECIMENS:  None.   NIK D: 07/06/2021 8:10:38 am T: 07/06/2021 9:47:00 am  JOB: [id number]/ [id number]

## 2021-07-07 ENCOUNTER — Encounter (HOSPITAL_COMMUNITY): Payer: Self-pay | Admitting: Oral Surgery

## 2021-07-08 NOTE — Anesthesia Postprocedure Evaluation (Signed)
Anesthesia Post Note  Patient: Audrey Joseph  Procedure(s) Performed: EXTRACTION TEETH NUMBER SIXTEEN, SEVENTEEN, THIRY-TWO (Mouth)     Patient location during evaluation: PACU Anesthesia Type: General Level of consciousness: awake and alert Pain management: pain level controlled Vital Signs Assessment: post-procedure vital signs reviewed and stable Respiratory status: spontaneous breathing, nonlabored ventilation, respiratory function stable and patient connected to nasal cannula oxygen Cardiovascular status: blood pressure returned to baseline and stable Postop Assessment: no apparent nausea or vomiting Anesthetic complications: no   No notable events documented.  Last Vitals:  Vitals:   07/06/21 0838 07/06/21 0845  BP:  (!) 147/99  Pulse: 76   Resp: 15 14  Temp:  36.7 C  SpO2: 100% 100%    Last Pain:  Vitals:   07/06/21 0838  TempSrc:   PainSc: 10-Worst pain ever                 Lettie Czarnecki L Lynsee Wands
